# Patient Record
Sex: Female | Born: 1960 | Race: Black or African American | Hispanic: No | Marital: Married | Smoking: Never smoker
Health system: Southern US, Community
[De-identification: ages and names within clinical notes are randomized; demographics above are authoritative.]

## PROBLEM LIST (undated history)

## (undated) DIAGNOSIS — E119 Type 2 diabetes mellitus without complications: Secondary | ICD-10-CM

## (undated) DIAGNOSIS — I10 Essential (primary) hypertension: Secondary | ICD-10-CM

## (undated) HISTORY — PX: BREAST SURGERY: SHX581

## (undated) HISTORY — PX: ABDOMINAL HYSTERECTOMY: SHX81

## (undated) HISTORY — PX: NASAL SINUS SURGERY: SHX719

---

## 2015-02-28 ENCOUNTER — Encounter (HOSPITAL_BASED_OUTPATIENT_CLINIC_OR_DEPARTMENT_OTHER): Payer: Self-pay | Admitting: *Deleted

## 2015-02-28 ENCOUNTER — Emergency Department (HOSPITAL_BASED_OUTPATIENT_CLINIC_OR_DEPARTMENT_OTHER)
Admission: EM | Admit: 2015-02-28 | Discharge: 2015-02-28 | Disposition: A | Discharge status: Home / Self Care | Payer: Medicaid Other | Attending: Emergency Medicine | Admitting: Emergency Medicine

## 2015-02-28 ENCOUNTER — Emergency Department (HOSPITAL_BASED_OUTPATIENT_CLINIC_OR_DEPARTMENT_OTHER): Payer: Medicaid Other

## 2015-02-28 DIAGNOSIS — E119 Type 2 diabetes mellitus without complications: Secondary | ICD-10-CM | POA: Diagnosis not present

## 2015-02-28 DIAGNOSIS — Z7982 Long term (current) use of aspirin: Secondary | ICD-10-CM | POA: Diagnosis not present

## 2015-02-28 DIAGNOSIS — S63610A Unspecified sprain of right index finger, initial encounter: Secondary | ICD-10-CM | POA: Insufficient documentation

## 2015-02-28 DIAGNOSIS — I10 Essential (primary) hypertension: Secondary | ICD-10-CM | POA: Diagnosis not present

## 2015-02-28 DIAGNOSIS — Y9289 Other specified places as the place of occurrence of the external cause: Secondary | ICD-10-CM | POA: Diagnosis not present

## 2015-02-28 DIAGNOSIS — W182XXA Fall in (into) shower or empty bathtub, initial encounter: Secondary | ICD-10-CM | POA: Diagnosis not present

## 2015-02-28 DIAGNOSIS — S6991XA Unspecified injury of right wrist, hand and finger(s), initial encounter: Secondary | ICD-10-CM | POA: Diagnosis present

## 2015-02-28 DIAGNOSIS — Y93E1 Activity, personal bathing and showering: Secondary | ICD-10-CM | POA: Diagnosis not present

## 2015-02-28 DIAGNOSIS — S63612A Unspecified sprain of right middle finger, initial encounter: Secondary | ICD-10-CM | POA: Insufficient documentation

## 2015-02-28 DIAGNOSIS — Y998 Other external cause status: Secondary | ICD-10-CM | POA: Insufficient documentation

## 2015-02-28 DIAGNOSIS — S63619A Unspecified sprain of unspecified finger, initial encounter: Secondary | ICD-10-CM

## 2015-02-28 HISTORY — DX: Type 2 diabetes mellitus without complications: E11.9

## 2015-02-28 HISTORY — DX: Essential (primary) hypertension: I10

## 2015-02-28 MED ORDER — HYDROCODONE-ACETAMINOPHEN 5-325 MG PO TABS: 1.0000 | ORAL_TABLET | Freq: Four times a day (QID) | ORAL | Status: DC | PRN

## 2015-02-28 MED ORDER — HYDROCODONE-ACETAMINOPHEN 5-325 MG PO TABS
1.0000 | ORAL_TABLET | Freq: Once | ORAL | Status: AC
  Administered 2015-02-28: 1 via ORAL
  Filled 2015-02-28: qty 1

## 2015-02-28 MED ORDER — NAPROXEN 500 MG PO TABS: 500.0000 mg | ORAL_TABLET | Freq: Two times a day (BID) | ORAL | Status: DC

## 2015-02-28 NOTE — ED Notes (Signed)
Larey SeatFell getting out of the shower. Right hand injury.

## 2015-02-28 NOTE — ED Provider Notes (Signed)
CSN: [id number]     Arrival date & time 02/28/15  2130 History  This chart was scribed for Gwyneth SproutWhitney Joanthan Hlavacek, MD by Richarda Overlieichard Holland, ED Scribe. This patient was seen in room MH01/MH01 and the patient's care was started 10:28 PM.     Chief Complaint  Patient presents with  . Hand Injury   HPI HPI Comments: Sharon Hamilton is a 54 y.o. female who presents to the Emergency Department complaining of a right hand injury that occurred earlier tonight. Pt states she was getting out of the shower earlier when she slipped and fell. She states that she hit her right hand on the wall when she was falling. Pt complains of constant pain over her right 2nd and 3rd digits at this time. She denies any right wrist pain. Pt reports she is allergic to diovan. Pt reports that certain movements aggravates her pain. She reports no alleviating factors at this time.   Past Medical History  Diagnosis Date  . Diabetes mellitus without complication   . Hypertension    Past Surgical History  Procedure Laterality Date  . Abdominal hysterectomy    . Nasal sinus surgery    . Breast surgery     No family history on file. History  Substance Use Topics  . Smoking status: Never Smoker   . Smokeless tobacco: Not on file  . Alcohol Use: No   OB History    No data available     Review of Systems  Musculoskeletal: Positive for arthralgias.  A complete 10 system review of systems was obtained and all systems are negative except as noted in the HPI and PMH.   Allergies  Review of patient's allergies indicates no known allergies.  Home Medications   Prior to Admission medications   Medication Sig Start Date End Date Taking? Authorizing Provider  aspirin 81 MG tablet Take 81 mg by mouth daily.   Yes Historical Provider, MD  GLIPIZIDE PO Take by mouth.   Yes Historical Provider, MD  HYDROCHLOROTHIAZIDE PO Take by mouth.   Yes Historical Provider, MD  Metoprolol Succinate (TOPROL XL PO) Take by mouth.   Yes Historical  Provider, MD  METOPROLOL TARTRATE PO Take by mouth.   Yes Historical Provider, MD  SitaGLIPtin-MetFORMIN HCl (JANUMET PO) Take by mouth.   Yes Historical Provider, MD   BP 164/100 mmHg  Pulse 62  Temp(Src) 98 F (36.7 C) (Oral)  Resp 20  Ht 5\' 6"  (1.676 m)  Wt 250 lb (113.399 kg)  BMI 40.37 kg/m2  SpO2 96%   Physical Exam  Constitutional: She is oriented to person, place, and time. She appears well-developed and well-nourished.  HENT:  Head: Normocephalic and atraumatic.  Eyes: Right eye exhibits no discharge. Left eye exhibits no discharge.  Neck: Neck supple. No tracheal deviation present.  Cardiovascular: Normal rate.   Pulmonary/Chest: Effort normal. No respiratory distress.  Abdominal: She exhibits no distension.  Musculoskeletal: She exhibits tenderness.  Tenderness over the right 3rd MTP joint. Normal sensation and function of the fingers. No wrist tenderness. 2+ radial pulses.  Neurological: She is alert and oriented to person, place, and time.  Skin: Skin is warm and dry.  Psychiatric: She has a normal mood and affect. Her behavior is normal.  Nursing note and vitals reviewed.   ED Course  Procedures  DIAGNOSTIC STUDIES: Oxygen Saturation is 96% on RA, normal by my interpretation.    COORDINATION OF CARE: 10:30 PM Discussed treatment plan with pt at bedside and pt agreed  to plan.  Labs Review Labs Reviewed - No data to display  Imaging Review Dg Hand Complete Right  02/28/2015   CLINICAL DATA:  Fall getting out of shower tonight. Hit right hand. Swelling along right index and middle fingers.  EXAM: RIGHT HAND - COMPLETE 3+ VIEW  COMPARISON:  06/29/2014  FINDINGS: No acute bony abnormality. Specifically, no fracture, subluxation, or dislocation. Soft tissues are intact.  IMPRESSION: No acute bony abnormality.   Electronically Signed   By: Charlett Nose M.D.   On: 02/28/2015 21:50     EKG Interpretation None      MDM   Final diagnoses:  Finger sprain,  initial encounter   Patient with mechanical fall when she got out of the shower today and hit her hand on the wall. She is complaining of tenderness over the third digit. Imaging is negative. Patient treated for finger sprain.  I personally performed the services described in this documentation, which was scribed in my presence.  The recorded information has been reviewed and considered.      Gwyneth Sprout, MD 02/28/15 2326

## 2015-02-28 NOTE — Discharge Instructions (Signed)
Finger Sprain  A finger sprain is a tear in one of the strong, fibrous tissues that connect the bones (ligaments) in your finger. The severity of the sprain depends on how much of the ligament is torn. The tear can be either partial or complete.  CAUSES   Often, sprains are a result of a fall or accident. If you extend your hands to catch an object or to protect yourself, the force of the impact causes the fibers of your ligament to stretch too much. This excess tension causes the fibers of your ligament to tear.  SYMPTOMS   You may have some loss of motion in your finger. Other symptoms include:   Bruising.   Tenderness.   Swelling.  DIAGNOSIS   In order to diagnose finger sprain, your caregiver will physically examine your finger or thumb to determine how torn the ligament is. Your caregiver may also suggest an X-ray exam of your finger to make sure no bones are broken.  TREATMENT   If your ligament is only partially torn, treatment usually involves keeping the finger in a fixed position (immobilization) for a short period. To do this, your caregiver will apply a bandage, cast, or splint to keep your finger from moving until it heals. For a partially torn ligament, the healing process usually takes 2 to 3 weeks.  If your ligament is completely torn, you may need surgery to reconnect the ligament to the bone. After surgery a cast or splint will be applied and will need to stay on your finger or thumb for 4 to 6 weeks while your ligament heals.  HOME CARE INSTRUCTIONS   Keep your injured finger elevated, when possible, to decrease swelling.   To ease pain and swelling, apply ice to your joint twice a day, for 2 to 3 days:   Put ice in a plastic bag.   Place a towel between your skin and the bag.   Leave the ice on for 15 minutes.   Only take over-the-counter or prescription medicine for pain as directed by your caregiver.   Do not wear rings on your injured finger.   Do not leave your finger unprotected  until pain and stiffness go away (usually 3 to 4 weeks).   Do not allow your cast or splint to get wet. Cover your cast or splint with a plastic bag when you shower or bathe. Do not swim.   Your caregiver may suggest special exercises for you to do during your recovery to prevent or limit permanent stiffness.  SEEK IMMEDIATE MEDICAL CARE IF:   Your cast or splint becomes damaged.   Your pain becomes worse rather than better.  MAKE SURE YOU:   Understand these instructions.   Will watch your condition.   Will get help right away if you are not doing well or get worse.  Document Released: 10/17/2004 Document Revised: 12/02/2011 Document Reviewed: 05/13/2011  [employer] Patient Information 2015 [employer]. This information is not intended to replace advice given to you by your health care provider. Make sure you discuss any questions you have with your health care provider.

## 2016-08-27 ENCOUNTER — Emergency Department (HOSPITAL_BASED_OUTPATIENT_CLINIC_OR_DEPARTMENT_OTHER)
Admission: EM | Admit: 2016-08-27 | Discharge: 2016-08-27 | Disposition: A | Discharge status: Home / Self Care | Payer: Medicaid Other | Attending: Emergency Medicine | Admitting: Emergency Medicine

## 2016-08-27 ENCOUNTER — Encounter (HOSPITAL_BASED_OUTPATIENT_CLINIC_OR_DEPARTMENT_OTHER): Payer: Self-pay | Admitting: *Deleted

## 2016-08-27 DIAGNOSIS — I10 Essential (primary) hypertension: Secondary | ICD-10-CM | POA: Diagnosis not present

## 2016-08-27 DIAGNOSIS — Z7982 Long term (current) use of aspirin: Secondary | ICD-10-CM | POA: Diagnosis not present

## 2016-08-27 DIAGNOSIS — X58XXXA Exposure to other specified factors, initial encounter: Secondary | ICD-10-CM | POA: Diagnosis not present

## 2016-08-27 DIAGNOSIS — Y929 Unspecified place or not applicable: Secondary | ICD-10-CM | POA: Diagnosis not present

## 2016-08-27 DIAGNOSIS — Y999 Unspecified external cause status: Secondary | ICD-10-CM | POA: Diagnosis not present

## 2016-08-27 DIAGNOSIS — Y939 Activity, unspecified: Secondary | ICD-10-CM | POA: Insufficient documentation

## 2016-08-27 DIAGNOSIS — Z794 Long term (current) use of insulin: Secondary | ICD-10-CM | POA: Diagnosis not present

## 2016-08-27 DIAGNOSIS — S39012A Strain of muscle, fascia and tendon of lower back, initial encounter: Secondary | ICD-10-CM

## 2016-08-27 DIAGNOSIS — S3992XA Unspecified injury of lower back, initial encounter: Secondary | ICD-10-CM | POA: Diagnosis present

## 2016-08-27 DIAGNOSIS — E119 Type 2 diabetes mellitus without complications: Secondary | ICD-10-CM | POA: Insufficient documentation

## 2016-08-27 LAB — URINALYSIS, ROUTINE W REFLEX MICROSCOPIC
Bilirubin Urine: NEGATIVE
Glucose, UA: >1000 mg/dL — AB
Hgb urine dipstick: NEGATIVE
Ketones, ur: NEGATIVE mg/dL
LEUKOCYTES UA: NEGATIVE
Nitrite: NEGATIVE
Protein, ur: NEGATIVE mg/dL
SPECIFIC GRAVITY, URINE: 1.042 — AB (ref 1.005–1.030)
pH: 5 (ref 5.0–8.0)

## 2016-08-27 MED ORDER — HYDROCODONE-ACETAMINOPHEN 5-325 MG PO TABS: 1.0000 | ORAL_TABLET | Freq: Four times a day (QID) | ORAL | 0 refills | Status: AC | PRN

## 2016-08-27 MED ORDER — NAPROXEN 500 MG PO TABS: 500.0000 mg | ORAL_TABLET | Freq: Two times a day (BID) | ORAL | 0 refills | Status: AC | PRN

## 2016-08-27 MED ORDER — METHOCARBAMOL 500 MG PO TABS: 500.0000 mg | ORAL_TABLET | Freq: Two times a day (BID) | ORAL | 0 refills | Status: AC | PRN

## 2016-08-27 NOTE — ED Triage Notes (Signed)
Lower back pain x 2 days. Feels like muscle spasms.

## 2016-08-27 NOTE — ED Provider Notes (Signed)
[facility]   CSN: [id number] Arrival date & time: 08/27/16  1724  By signing my [id number] below, I, Soijett Blue, attest that this documentation has been prepared under the direction and in the presence of Elizabeth SauerJaime Ward, PA-C Electronically Signed: Soijett Blue, ED Scribe. 08/27/16. 8:06 PM.  History   Chief Complaint Chief Complaint  Patient presents with  . Back Pain    HPI Sharon Hamilton is a 55 y.o. female with a PMHx of DM, HTN, who presents to the Emergency Department complaining of waxing and waning, shooting, stabbing, right mid-to-lower back pain onset 2 days ago. Patient states she started walking and walked around the mall two times which is a change from baseline activity. She reports that the back pain does radiate to her right side. Pt rates her right mid-to-lower back pain as 4/5 at rest and 10/10 with movement. Pt states that her right mid-to-lower back pain is worsened with position change and denies alleviating factors for her right mid-to-lower back pain. She states that she has tried 3 aleve with mild relief for her symptoms. Pt denies numbness, tingling, saddle paresthesia, bowel/bladder incontinence, fever, chills, hematuria, dysuria, abdominal pain, and any other symptoms. Pt states that her blood sugar ranges from 120-170 typically. Pt notes that her PCP is Dr. Dennis BastYuri Cabeza.     The history is provided by the patient. No language interpreter was used.    Past Medical History:  Diagnosis Date  . Diabetes mellitus without complication (HCC)   . Hypertension     There are no active problems to display for this patient.   Past Surgical History:  Procedure Laterality Date  . ABDOMINAL HYSTERECTOMY    . BREAST SURGERY    . NASAL SINUS SURGERY      OB History    No data available       Home Medications    Prior to Admission medications   Medication Sig Start Date End Date Taking? Authorizing Provider  AMLODIPINE BESYLATE PO Take by  mouth.   Yes Historical Provider, MD  aspirin 81 MG tablet Take 81 mg by mouth daily.   Yes Historical Provider, MD  Canagliflozin (INVOKANA PO) Take by mouth.   Yes Historical Provider, MD  HYDROCHLOROTHIAZIDE PO Take by mouth.   Yes Historical Provider, MD  insulin regular (NOVOLIN R,HUMULIN R) 250 units/2.385mL (100 units/mL) injection Inject into the skin 3 (three) times daily before meals.   Yes Historical Provider, MD  METFORMIN HCL PO Take by mouth.   Yes Historical Provider, MD  METOPROLOL TARTRATE PO Take by mouth.   Yes Historical Provider, MD  GLIPIZIDE PO Take by mouth.    Historical Provider, MD  HYDROcodone-acetaminophen (NORCO/VICODIN) 5-325 MG tablet Take 1 tablet by mouth every 6 (six) hours as needed for severe pain. 08/27/16   Chase PicketJaime Pilcher Ward, PA-C  methocarbamol (ROBAXIN) 500 MG tablet Take 1 tablet (500 mg total) by mouth 2 (two) times daily as needed for muscle spasms. 08/27/16   Chase PicketJaime Pilcher Ward, PA-C  Metoprolol Succinate (TOPROL XL PO) Take by mouth.    Historical Provider, MD  naproxen (NAPROSYN) 500 MG tablet Take 1 tablet (500 mg total) by mouth 2 (two) times daily as needed. 08/27/16   Jaime Pilcher Ward, PA-C  SitaGLIPtin-MetFORMIN HCl (JANUMET PO) Take by mouth.    Historical Provider, MD    Family History No family history on file.  Social History Social History  Substance Use Topics  . Smoking status: Never Smoker  .  Smokeless tobacco: Never Used  . Alcohol use No     Allergies   Patient has no known allergies.   Review of Systems Review of Systems  Constitutional: Negative for chills and fever.  Gastrointestinal: Negative for abdominal pain.       No bowel incontinence.   Genitourinary: Negative for dysuria and hematuria.       No bladder incontinence.   Musculoskeletal: Positive for back pain (right mid to lower).  Neurological: Negative for numbness.       No tingling    Physical Exam Updated Vital Signs BP 128/80 (BP Location: Right  Arm)   Pulse 70   Temp 98.2 F (36.8 C) (Oral)   Resp 16   Ht 5\' 7"  (1.702 m)   Wt 113.4 kg   SpO2 94%   BMI 39.16 kg/m   Physical Exam  Constitutional: She is oriented to person, place, and time. She appears well-developed and well-nourished. No distress.  HENT:  Head: Normocephalic and atraumatic.  Cardiovascular: Normal rate, regular rhythm and normal heart sounds.   No murmur heard. Pulmonary/Chest: Effort normal and breath sounds normal. No respiratory distress.  Abdominal: Soft. She exhibits no distension. There is no tenderness.  Musculoskeletal: She exhibits no edema.       Lumbar back: She exhibits tenderness. She exhibits no bony tenderness.  No lumbar midline spinal tenderness. Pain reproducible with torso rotation. Negative SLR.  Neurological: She is alert and oriented to person, place, and time. No sensory deficit.  BLE NVI.  Skin: Skin is warm and dry.  Nursing note and vitals reviewed.  ED Treatments / Results  DIAGNOSTIC STUDIES: Oxygen Saturation is 100% on RA, nl by my interpretation.    COORDINATION OF CARE: 8:03 PM Discussed treatment plan with pt at bedside which includes UA, norco Rx, robaxin Rx, naprosyn Rx and pt agreed to plan.   Labs (all labs ordered are listed, but only abnormal results are displayed) Labs Reviewed  URINALYSIS, ROUTINE W REFLEX MICROSCOPIC - Abnormal; Notable for the following:       Result Value   Specific Gravity, Urine 1.042 (*)    Glucose, UA >1000 (*)    All other components within normal limits    Procedures Procedures (including critical care time)  Medications Ordered in ED Medications - No data to display   Initial Impression / Assessment and Plan / ED Course  I have reviewed the triage vital signs and the nursing notes.  Pertinent labs that were available during my care of the patient were reviewed by me and considered in my medical decision making (see chart for details).  Clinical Course as of Aug 27 2112  Tue Aug 27, 2016  2107 History of DM- sugars running 170's. No sxs of DKA Glucose: (!) >1000 [JW]    Clinical Course User Index [JW] [facility] CenterJaime Pilcher Ward, PA-C   Sharon Hamilton presents with back pain. Patient demonstrates no lower extremity weakness, saddle anesthesia, bowel or bladder incontinence, or neuro deficits. No concern for cauda equina. No fevers or other infectious symptoms to suggest that the patient's back pain is due to an infection. Lower extremities are neurovascularly intact and patient is ambulating without difficulty. I have reviewed return precautions, including the development of any of these signs or symptoms, and the patient has voiced understanding. I reviewed supportive care instructions, including NSAIDs, early range of motion exercises, and PCP follow-up if symptoms do not improve for referral to physical therapy. RX for naproxen, robaxin. Patient  voiced understanding and agreement with plan.    Final Clinical Impressions(s) / ED Diagnoses   Final diagnoses:  Strain of lumbar region, initial encounter    New Prescriptions Discharge Medication List as of 08/27/2016  8:22 PM    START taking these medications   Details  methocarbamol (ROBAXIN) 500 MG tablet Take 1 tablet (500 mg total) by mouth 2 (two) times daily as needed for muscle spasms., Starting Tue 08/27/2016, Print       I personally performed the services described in this documentation, which was scribed in my presence. The recorded information has been reviewed and is accurate.    [facility] Ward, PA-C 08/27/16 2113    Maia Plan, MD 08/28/16 1018

## 2016-08-27 NOTE — Discharge Instructions (Signed)
Naproxen as needed for pain. Robaxin is your muscle relaxer to take as needed. Pain medication only at night as needed for severe pain.   Back Pain:  Your back pain should be treated with medicines such as ibuprofen or aleve and this back pain should get better over the next 2 weeks.  However if you develop severe or worsening pain, low back pain with fever, numbness, weakness or inability to walk or urinate, you should return to the ER immediately.  Please follow up with your doctor this week for a recheck if still having symptoms.  Low back pain is discomfort in the lower back that may be due to injuries to muscles and ligaments around the spine. Occasionally, it may be caused by a a problem to a part of the spine called a disc. The pain may last several days or a week;  However, most patients get completely well in 4 weeks.  COLD THERAPY DIRECTIONS:  Ice or gel packs can be used to reduce both pain and swelling. Ice is the most helpful within the first 24 to 48 hours after an injury or flareup from overusing a muscle or joint.  Ice is effective, has very few side effects, and is safe for most people to use.   If you expose your skin to cold temperatures for too long or without the proper protection, you can damage your skin or nerves. Watch for signs of skin damage due to cold.   HOME CARE INSTRUCTIONS  Follow these tips to use ice and cold packs safely.  Place a dry or damp towel between the ice and skin. A damp towel will cool the skin more quickly, so you may need to shorten the time that the ice is used.  For a more rapid response, add gentle compression to the ice.  Ice for no more than 10 to 20 minutes at a time. The bonier the area you are icing, the less time it will take to get the benefits of ice.  Check your skin after 5 minutes to make sure there are no signs of a poor response to cold or skin damage.  Rest 20 minutes or more in between uses.  Once your skin is numb, you can end  your treatment. You can test numbness by very lightly touching your skin. The touch should be so light that you do not see the skin dimple from the pressure of your fingertip. When using ice, most people will feel these normal sensations in this order: cold, burning, aching, and numbness.   You will need to follow up with your primary healthcare provider in 1-2 weeks for reassessment.  Be aware that if you develop new symptoms, such as a fever, leg weakness, difficulty with or loss of control of your urine or bowels, abdominal pain, or more severe pain, you will need to seek medical attention and  / or return to the Emergency department.

## 2016-08-28 LAB — URINALYSIS, MICROSCOPIC (REFLEX): RBC / HPF: NONE SEEN RBC/hpf (ref 0–5)

## 2016-10-17 ENCOUNTER — Emergency Department (HOSPITAL_BASED_OUTPATIENT_CLINIC_OR_DEPARTMENT_OTHER)
Admission: EM | Admit: 2016-10-17 | Discharge: 2016-10-17 | Disposition: A | Discharge status: Home / Self Care | Payer: Medicaid Other | Attending: Emergency Medicine | Admitting: Emergency Medicine

## 2016-10-17 ENCOUNTER — Emergency Department (HOSPITAL_BASED_OUTPATIENT_CLINIC_OR_DEPARTMENT_OTHER): Payer: Medicaid Other

## 2016-10-17 ENCOUNTER — Encounter (HOSPITAL_BASED_OUTPATIENT_CLINIC_OR_DEPARTMENT_OTHER): Payer: Self-pay | Admitting: *Deleted

## 2016-10-17 DIAGNOSIS — I10 Essential (primary) hypertension: Secondary | ICD-10-CM | POA: Insufficient documentation

## 2016-10-17 DIAGNOSIS — Z794 Long term (current) use of insulin: Secondary | ICD-10-CM | POA: Diagnosis not present

## 2016-10-17 DIAGNOSIS — E876 Hypokalemia: Secondary | ICD-10-CM | POA: Diagnosis not present

## 2016-10-17 DIAGNOSIS — E119 Type 2 diabetes mellitus without complications: Secondary | ICD-10-CM | POA: Diagnosis not present

## 2016-10-17 DIAGNOSIS — J189 Pneumonia, unspecified organism: Secondary | ICD-10-CM | POA: Diagnosis not present

## 2016-10-17 DIAGNOSIS — Z7982 Long term (current) use of aspirin: Secondary | ICD-10-CM | POA: Diagnosis not present

## 2016-10-17 DIAGNOSIS — Z79899 Other long term (current) drug therapy: Secondary | ICD-10-CM | POA: Diagnosis not present

## 2016-10-17 DIAGNOSIS — J111 Influenza due to unidentified influenza virus with other respiratory manifestations: Secondary | ICD-10-CM

## 2016-10-17 DIAGNOSIS — R05 Cough: Secondary | ICD-10-CM | POA: Diagnosis present

## 2016-10-17 DIAGNOSIS — R69 Illness, unspecified: Secondary | ICD-10-CM

## 2016-10-17 LAB — BASIC METABOLIC PANEL
ANION GAP: 10 (ref 5–15)
BUN: 14 mg/dL (ref 6–20)
CALCIUM: 9.8 mg/dL (ref 8.9–10.3)
CO2: 28 mmol/L (ref 22–32)
CREATININE: 0.83 mg/dL (ref 0.44–1.00)
Chloride: 98 mmol/L — ABNORMAL LOW (ref 101–111)
GFR calc Af Amer: >60 mL/min (ref >60)
GFR calc non Af Amer: >60 mL/min (ref >60)
GLUCOSE: 158 mg/dL — AB (ref 65–99)
Potassium: 2.9 mmol/L — ABNORMAL LOW (ref 3.5–5.1)
Sodium: 136 mmol/L (ref 135–145)

## 2016-10-17 LAB — URINALYSIS, ROUTINE W REFLEX MICROSCOPIC
Bilirubin Urine: NEGATIVE
Glucose, UA: >=500 mg/dL — AB
HGB URINE DIPSTICK: NEGATIVE
KETONES UR: 15 mg/dL — AB
LEUKOCYTES UA: NEGATIVE
Nitrite: NEGATIVE
PH: 6 (ref 5.0–8.0)
Protein, ur: NEGATIVE mg/dL
Specific Gravity, Urine: 1.027 (ref 1.005–1.030)

## 2016-10-17 LAB — CBC WITH DIFFERENTIAL/PLATELET
Basophils Absolute: 0 10*3/uL (ref 0.0–0.1)
Basophils Relative: 0 %
EOS PCT: 2 %
Eosinophils Absolute: 0.1 10*3/uL (ref 0.0–0.7)
HEMATOCRIT: 44 % (ref 36.0–46.0)
Hemoglobin: 14.9 g/dL (ref 12.0–15.0)
LYMPHS ABS: 1.3 10*3/uL (ref 0.7–4.0)
LYMPHS PCT: 21 %
MCH: 29.3 pg (ref 26.0–34.0)
MCHC: 33.9 g/dL (ref 30.0–36.0)
MCV: 86.6 fL (ref 78.0–100.0)
MONO ABS: 0.6 10*3/uL (ref 0.1–1.0)
MONOS PCT: 10 %
Neutro Abs: 4.1 10*3/uL (ref 1.7–7.7)
Neutrophils Relative %: 67 %
Platelets: 245 10*3/uL (ref 150–400)
RBC: 5.08 MIL/uL (ref 3.87–5.11)
RDW: 14.4 % (ref 11.5–15.5)
WBC: 6.2 10*3/uL (ref 4.0–10.5)

## 2016-10-17 LAB — CBG MONITORING, ED: Glucose-Capillary: 159 mg/dL — ABNORMAL HIGH (ref 65–99)

## 2016-10-17 LAB — URINALYSIS, MICROSCOPIC (REFLEX)

## 2016-10-17 LAB — I-STAT CG4 LACTIC ACID, ED: LACTIC ACID, VENOUS: 1.3 mmol/L (ref 0.5–1.9)

## 2016-10-17 MED ORDER — DEXTROSE 5 % IV SOLN
1.0000 g | Freq: Once | INTRAVENOUS | Status: AC
  Administered 2016-10-17: 1 g via INTRAVENOUS
  Filled 2016-10-17: qty 10

## 2016-10-17 MED ORDER — ONDANSETRON HCL 4 MG/2ML IJ SOLN
4.0000 mg | Freq: Once | INTRAMUSCULAR | Status: AC
  Administered 2016-10-17: 4 mg via INTRAVENOUS
  Filled 2016-10-17: qty 2

## 2016-10-17 MED ORDER — OSELTAMIVIR PHOSPHATE 75 MG PO CAPS: 75.0000 mg | ORAL_CAPSULE | Freq: Two times a day (BID) | ORAL | 0 refills | Status: AC

## 2016-10-17 MED ORDER — POTASSIUM CHLORIDE ER 10 MEQ PO TBCR: 10.0000 meq | EXTENDED_RELEASE_TABLET | Freq: Two times a day (BID) | ORAL | 0 refills | Status: AC

## 2016-10-17 MED ORDER — POTASSIUM CHLORIDE 10 MEQ/100ML IV SOLN
10.0000 meq | Freq: Once | INTRAVENOUS | Status: AC
  Administered 2016-10-17: 10 meq via INTRAVENOUS
  Filled 2016-10-17: qty 100

## 2016-10-17 MED ORDER — OSELTAMIVIR PHOSPHATE 75 MG PO CAPS
75.0000 mg | ORAL_CAPSULE | Freq: Once | ORAL | Status: AC
  Administered 2016-10-17: 75 mg via ORAL
  Filled 2016-10-17: qty 1

## 2016-10-17 MED ORDER — SODIUM CHLORIDE 0.9 % IV BOLUS (SEPSIS)
1000.0000 mL | Freq: Once | INTRAVENOUS | Status: AC
  Administered 2016-10-17: 1000 mL via INTRAVENOUS

## 2016-10-17 MED ORDER — ACETAMINOPHEN 500 MG PO TABS
1000.0000 mg | ORAL_TABLET | Freq: Once | ORAL | Status: AC
  Administered 2016-10-17: 1000 mg via ORAL
  Filled 2016-10-17: qty 2

## 2016-10-17 MED ORDER — DM-GUAIFENESIN ER 30-600 MG PO TB12: 1.0000 | ORAL_TABLET | Freq: Two times a day (BID) | ORAL | 0 refills | Status: AC

## 2016-10-17 MED ORDER — LEVOFLOXACIN 750 MG PO TABS: 750.0000 mg | ORAL_TABLET | Freq: Every day | ORAL | 0 refills | Status: AC

## 2016-10-17 MED ORDER — SODIUM CHLORIDE 0.9 % IV SOLN: INTRAVENOUS | Status: DC

## 2016-10-17 MED ORDER — HYDROMORPHONE HCL 1 MG/ML IJ SOLN
1.0000 mg | Freq: Once | INTRAMUSCULAR | Status: AC
  Administered 2016-10-17: 1 mg via INTRAVENOUS
  Filled 2016-10-17: qty 1

## 2016-10-17 NOTE — ED Notes (Signed)
ED Provider at bedside. 

## 2016-10-17 NOTE — ED Triage Notes (Signed)
Generalized body aches, chills, fever, since last night.

## 2016-10-17 NOTE — Discharge Instructions (Signed)
A symptoms consistent with influenza. Take Tamiflu as directed. Chest x-ray raises concerns for pneumonia take the antibiotic Levaquin as directed. Potassium was low here tonight as you know. Take the oral potassium supplements for the next 2 days as the directed. Make an appointment to follow-up with your regular doctor. Return for any new or worse symptoms.

## 2016-10-17 NOTE — ED Notes (Signed)
Patient is resting comfortably. 

## 2016-10-17 NOTE — ED Provider Notes (Addendum)
[facility] Provider Note   CSN: [id number] Arrival date & time: 10/17/16  1659 By signing my name below, I, Levon Hedger, attest that this documentation has been prepared under the direction and in the presence of Vanetta Mulders, MD . Electronically Signed: Levon Hedger, Scribe. 10/17/2016. 7:53 PM.   History   Chief Complaint Chief Complaint  Patient presents with  . Generalized Body Aches   HPI Sharon Hamilton is a 56 y.o. female with a history of DM who presents to the Emergency Department complaining of constant, gradually worsening generalized body aches onset yesterday. She notes associated congestion, cough, fever (tmax 101.9), SOB, headache, nausea, and loose bowel movements. No alleviating or modifying factors noted.  No prior treatments indicated. She states her husband has similar symptoms and has been sick for one week. Pt denies any visual disturbance, chest pain, abdominal pain, vomiting, diarrhea, dysuria, hematuria, joint swelling, or rash. She denies the use of blood thinners.   The history is provided by the patient. No language interpreter was used.   Past Medical History:  Diagnosis Date  . Diabetes mellitus without complication (HCC)   . Hypertension    There are no active problems to display for this patient.  Past Surgical History:  Procedure Laterality Date  . ABDOMINAL HYSTERECTOMY    . BREAST SURGERY    . NASAL SINUS SURGERY      OB History    No data available     Home Medications    Prior to Admission medications   Medication Sig Start Date End Date Taking? Authorizing Provider  AMLODIPINE BESYLATE PO Take by mouth.   Yes Historical Provider, MD  aspirin 81 MG tablet Take 81 mg by mouth daily.   Yes Historical Provider, MD  Canagliflozin (INVOKANA PO) Take by mouth.   Yes Historical Provider, MD  HYDROCHLOROTHIAZIDE PO Take by mouth.   Yes Historical Provider, MD  insulin regular (NOVOLIN R,HUMULIN R) 250 units/2.101mL (100  units/mL) injection Inject into the skin 3 (three) times daily before meals.   Yes Historical Provider, MD  METFORMIN HCL PO Take by mouth.   Yes Historical Provider, MD  Metoprolol Succinate (TOPROL XL PO) Take by mouth.   Yes Historical Provider, MD  GLIPIZIDE PO Take by mouth.    Historical Provider, MD  HYDROcodone-acetaminophen (NORCO/VICODIN) 5-325 MG tablet Take 1 tablet by mouth every 6 (six) hours as needed for severe pain. 08/27/16   Chase Picket Ward, PA-C  methocarbamol (ROBAXIN) 500 MG tablet Take 1 tablet (500 mg total) by mouth 2 (two) times daily as needed for muscle spasms. 08/27/16   Chase Picket Ward, PA-C  METOPROLOL TARTRATE PO Take by mouth.    Historical Provider, MD  naproxen (NAPROSYN) 500 MG tablet Take 1 tablet (500 mg total) by mouth 2 (two) times daily as needed. 08/27/16   Jaime Pilcher Ward, PA-C  SitaGLIPtin-MetFORMIN HCl (JANUMET PO) Take by mouth.    Historical Provider, MD    Family History No family history on file.  Social History Social History  Substance Use Topics  . Smoking status: Never Smoker  . Smokeless tobacco: Never Used  . Alcohol use No    Allergies   Diovan [valsartan]  Review of Systems Review of Systems  Constitutional: Positive for fever. Negative for chills.  HENT: Positive for congestion. Negative for rhinorrhea, sneezing and sore throat.   Eyes: Negative for visual disturbance.  Respiratory: Positive for shortness of breath. Negative for cough.   Cardiovascular: Negative for chest  pain.  Gastrointestinal: Positive for nausea. Negative for abdominal pain, diarrhea and vomiting.  Genitourinary: Negative for dysuria and hematuria.  Musculoskeletal: Positive for myalgias. Negative for back pain and joint swelling.  Skin: Negative for rash.  Neurological: Positive for headaches.  Hematological: Does not bruise/bleed easily.  Psychiatric/Behavioral: Negative for confusion.    Physical Exam Updated Vital Signs BP 134/84    Pulse 72   Temp 99.3 F (37.4 C) (Oral)   Resp 14   Ht 5\' 6"  (1.676 m)   Wt 113.4 kg   SpO2 98%   BMI 40.35 kg/m   Physical Exam  Constitutional: She is oriented to person, place, and time. She appears well-developed and well-nourished. No distress.  HENT:  Head: Normocephalic and atraumatic.  Mouth/Throat: No oropharyngeal exudate, posterior oropharyngeal edema or posterior oropharyngeal erythema.  Moist mucus membranes  Eyes: Conjunctivae and EOM are normal. Pupils are equal, round, and reactive to light. No scleral icterus.  Cardiovascular: Normal rate.  Exam reveals no gallop and no friction rub.   No murmur heard. Pulmonary/Chest: Effort normal.  Abdominal: Bowel sounds are normal. She exhibits no distension. There is no tenderness.  Neurological: She is alert and oriented to person, place, and time.  Skin: Skin is warm and dry.  Psychiatric: She has a normal mood and affect.  Nursing note and vitals reviewed.  ED Treatments / Results  DIAGNOSTIC STUDIES:  Oxygen Saturation is 97% on RA, normal by my interpretation.    COORDINATION OF CARE:  7:51 PM Discussed treatment plan with pt at bedside and pt agreed to plan.   Labs (all labs ordered are listed, but only abnormal results are displayed) Labs Reviewed  BASIC METABOLIC PANEL - Abnormal; Notable for the following:       Result Value   Potassium 2.9 (*)    Chloride 98 (*)    Glucose, Bld 158 (*)    All other components within normal limits  URINALYSIS, ROUTINE W REFLEX MICROSCOPIC - Abnormal; Notable for the following:    Glucose, UA >=500 (*)    Ketones, ur 15 (*)    All other components within normal limits  URINALYSIS, MICROSCOPIC (REFLEX) - Abnormal; Notable for the following:    Bacteria, UA RARE (*)    Squamous Epithelial / LPF 0-5 (*)    All other components within normal limits  CBG MONITORING, ED - Abnormal; Notable for the following:    Glucose-Capillary 159 (*)    All other components within  normal limits  CBC WITH DIFFERENTIAL/PLATELET  I-STAT CG4 LACTIC ACID, ED    EKG  EKG Interpretation None       Radiology Dg Chest 2 View  Result Date: 10/17/2016 CLINICAL DATA:  Acute onset of generalized body aches, congestion, cough and fever. Shortness of breath, headache and nausea. Initial encounter. EXAM: CHEST  2 VIEW COMPARISON:  None. FINDINGS: The lungs are well-aerated. Mild right basilar airspace opacity raises concern for pneumonia. There is no evidence of pleural effusion or pneumothorax. The heart is borderline normal in size. No acute osseous abnormalities are seen. IMPRESSION: Mild right basilar airspace opacity raises concern for pneumonia. Electronically Signed   By: Roanna Raider M.D.   On: 10/17/2016 20:48    Procedures Procedures (including critical care time)  CRITICAL CARE Performed by: Vanetta Mulders Total critical care time: 30 minutes Critical care time was exclusive of separately billable procedures and treating other patients. Critical care was necessary to treat or prevent imminent or life-threatening deterioration. Critical care was  time spent personally by me on the following activities: development of treatment plan with patient and/or surrogate as well as nursing, discussions with consultants, evaluation of patient's response to treatment, examination of patient, obtaining history from patient or surrogate, ordering and performing treatments and interventions, ordering and review of laboratory studies, ordering and review of radiographic studies, pulse oximetry and re-evaluation of patient's condition.   Medications Ordered in ED Medications  0.9 %  sodium chloride infusion (not administered)  cefTRIAXone (ROCEPHIN) 1 g in dextrose 5 % 50 mL IVPB (not administered)  acetaminophen (TYLENOL) tablet 1,000 mg (1,000 mg Oral Given 10/17/16 1728)  sodium chloride 0.9 % bolus 1,000 mL (0 mLs Intravenous Stopped 10/17/16 2148)  ondansetron (ZOFRAN)  injection 4 mg (4 mg Intravenous Given 10/17/16 2020)  HYDROmorphone (DILAUDID) injection 1 mg (1 mg Intravenous Given 10/17/16 2020)  potassium chloride 10 mEq in 100 mL IVPB (0 mEq Intravenous Stopped 10/17/16 2146)  potassium chloride 10 mEq in 100 mL IVPB (10 mEq Intravenous New Bag/Given 10/17/16 2146)  oseltamivir (TAMIFLU) capsule 75 mg (75 mg Oral Given 10/17/16 2128)     Initial Impression / Assessment and Plan / ED Course  I have reviewed the triage vital signs and the nursing notes.  Pertinent labs & imaging results that were available during my care of the patient were reviewed by me and considered in my medical decision making (see chart for details).    A symptoms consistent with flulike illness. Chest x-ray shows evidence of pneumonia. Patient has diabetes blood sugars reasonable. However patient's potassium was low as below 3. Patient received 10 mEq of potassium IV 2 here. Patient received IV fluids. Patient received Rocephin 1 g IV piggyback for the pneumonia. Patient overall showing improvement. Oxygen saturation 7 fine. Following completion of the potassium will recheck potassium level. If showing improvement above 3 patient can be discharged home and oral potassium. Will be continued on Levaquin since she is diabetic for the pneumonia. And she'll be continued on Tamiflu. She got her first dose Tamiflu here.  Oxygen saturations have been normal.     Final Clinical Impressions(s) / ED Diagnoses   Final diagnoses:  Influenza-like illness  Community acquired pneumonia, unspecified laterality  Hypokalemia    New Prescriptions New Prescriptions   No medications on file  I personally performed the services described in this documentation, which was scribed in my presence. The recorded information has been reviewed and is accurate.      Vanetta MuldersScott Susana Duell, MD 10/17/16 [id number]    Vanetta MuldersScott Dailey Alberson, MD 10/17/16 [phone]

## 2019-05-08 ENCOUNTER — Emergency Department (HOSPITAL_BASED_OUTPATIENT_CLINIC_OR_DEPARTMENT_OTHER)
Admission: EM | Admit: 2019-05-08 | Discharge: 2019-05-08 | Disposition: A | Discharge status: Home / Self Care | Payer: Medicaid Other | Attending: Emergency Medicine | Admitting: Emergency Medicine

## 2019-05-08 ENCOUNTER — Emergency Department (HOSPITAL_BASED_OUTPATIENT_CLINIC_OR_DEPARTMENT_OTHER): Payer: Medicaid Other

## 2019-05-08 ENCOUNTER — Other Ambulatory Visit: Payer: Self-pay

## 2019-05-08 DIAGNOSIS — E119 Type 2 diabetes mellitus without complications: Secondary | ICD-10-CM | POA: Insufficient documentation

## 2019-05-08 DIAGNOSIS — Z794 Long term (current) use of insulin: Secondary | ICD-10-CM | POA: Insufficient documentation

## 2019-05-08 DIAGNOSIS — M4802 Spinal stenosis, cervical region: Secondary | ICD-10-CM | POA: Diagnosis not present

## 2019-05-08 DIAGNOSIS — Z79899 Other long term (current) drug therapy: Secondary | ICD-10-CM | POA: Insufficient documentation

## 2019-05-08 DIAGNOSIS — I10 Essential (primary) hypertension: Secondary | ICD-10-CM | POA: Insufficient documentation

## 2019-05-08 DIAGNOSIS — M79602 Pain in left arm: Secondary | ICD-10-CM

## 2019-05-08 DIAGNOSIS — M5412 Radiculopathy, cervical region: Secondary | ICD-10-CM | POA: Insufficient documentation

## 2019-05-08 DIAGNOSIS — Z888 Allergy status to other drugs, medicaments and biological substances status: Secondary | ICD-10-CM | POA: Insufficient documentation

## 2019-05-08 DIAGNOSIS — M542 Cervicalgia: Secondary | ICD-10-CM

## 2019-05-08 DIAGNOSIS — M25512 Pain in left shoulder: Secondary | ICD-10-CM | POA: Diagnosis present

## 2019-05-08 MED ORDER — LIDOCAINE 5 % EX PTCH: 1.0000 | MEDICATED_PATCH | CUTANEOUS | 0 refills | Status: AC

## 2019-05-08 MED ORDER — OXYCODONE-ACETAMINOPHEN 5-325 MG PO TABS
1.0000 | ORAL_TABLET | ORAL | 0 refills | Status: AC | PRN
Start: 2019-05-08 — End: ?

## 2019-05-08 MED ORDER — LORAZEPAM 2 MG/ML IJ SOLN
1.0000 mg | Freq: Once | INTRAMUSCULAR | Status: AC
  Administered 2019-05-08: 1 mg via INTRAVENOUS
  Filled 2019-05-08: qty 1

## 2019-05-08 MED ORDER — PREDNISONE 10 MG PO TABS: ORAL_TABLET | ORAL | 0 refills | Status: AC

## 2019-05-08 MED ORDER — MORPHINE SULFATE (PF) 4 MG/ML IV SOLN
4.0000 mg | Freq: Once | INTRAVENOUS | Status: AC
  Administered 2019-05-08: 4 mg via INTRAVENOUS
  Filled 2019-05-08: qty 1

## 2019-05-08 NOTE — Discharge Instructions (Signed)
Your history, exam, and imaging today are consistent with a nerve pain from your neck causing your symptoms.  Your MRI did not show evidence of cord compromise or significant injury however there was advanced degenerative disease likely causing your symptoms.  After our shared decision-making conversation, we agreed on treatment with steroids, Lidoderm patch, and pain medication.  Please keep an eye on your glucose given your diabetes with the steroids.  Please follow-up with your pain team and your spine team.  If you are unable to see your spine team, please try the neurosurgeon listed in this paperwork for evaluation.  If any symptoms change or worsen, please return to the nearest emergency department.

## 2019-05-08 NOTE — ED Notes (Signed)
Taken to MRI at this time.  Premedicated with ativan per order.

## 2019-05-08 NOTE — ED Notes (Signed)
Provider at the bedside.  

## 2019-05-08 NOTE — ED Triage Notes (Signed)
Patient states that she was weeding in her yard three days ago and hurt her left shoulder. She reports that she has increased pain with movement to her left shoulder and up into her neck  - denies any numbness or tingling

## 2019-05-08 NOTE — ED Provider Notes (Signed)
[facility] EMERGENCY DEPARTMENT Provider Note   CSN: [id number] Arrival date & time: 05/08/19  1456     History   Chief Complaint Chief Complaint  Patient presents with   Shoulder Pain    HPI Sharon Hamilton is a 58 y.o. female.      Shoulder Pain Location:  Arm Injury: yes   Time since incident:  3 days Mechanism of injury comment:  Jerking injury Pain details:    Quality:  Shooting, tingling and sharp   Radiates to: neck.   Severity:  Severe   Onset quality:  Sudden   Timing:  Constant   Progression:  Unchanged Handedness:  Right-handed Dislocation: no   Foreign body present:  Unable to specify Prior injury to area:  Yes Relieved by:  Nothing Worsened by:  Movement Associated symptoms: neck pain   Associated symptoms: no back pain, no fatigue and no fever     Past Medical History:  Diagnosis Date   Diabetes mellitus without complication (HCC)    Hypertension     There are no active problems to display for this patient.   Past Surgical History:  Procedure Laterality Date   ABDOMINAL HYSTERECTOMY     BREAST SURGERY     NASAL SINUS SURGERY       OB History   No obstetric history on file.      Home Medications    Prior to Admission medications   Medication Sig Start Date End Date Taking? Authorizing Provider  AMLODIPINE BESYLATE PO Take by mouth.    [provider]  aspirin 81 MG tablet Take 81 mg by mouth daily.    [provider]  Canagliflozin (INVOKANA PO) Take by mouth.    [provider]  dextromethorphan-guaiFENesin (MUCINEX DM) 30-600 MG 12hr tablet Take 1 tablet by mouth 2 (two) times daily. 10/17/16   Vanetta MuldersZackowski, Scott, MD  GLIPIZIDE PO Take by mouth.    [provider]  HYDROCHLOROTHIAZIDE PO Take by mouth.    [provider]  HYDROcodone-acetaminophen (NORCO/VICODIN) 5-325 MG tablet Take 1 tablet by mouth every 6 (six) hours as needed for severe pain. 08/27/16   Ward, Chase PicketJaime  Pilcher, PA-C  insulin regular (NOVOLIN R,HUMULIN R) 250 units/2.395mL (100 units/mL) injection Inject into the skin 3 (three) times daily before meals.    [provider]  levofloxacin (LEVAQUIN) 750 MG tablet Take 1 tablet (750 mg total) by mouth daily. 10/17/16   Vanetta MuldersZackowski, Scott, MD  METFORMIN HCL PO Take by mouth.    [provider]  methocarbamol (ROBAXIN) 500 MG tablet Take 1 tablet (500 mg total) by mouth 2 (two) times daily as needed for muscle spasms. 08/27/16   Ward, Chase PicketJaime Pilcher, PA-C  Metoprolol Succinate (TOPROL XL PO) Take by mouth.    [provider]  METOPROLOL TARTRATE PO Take by mouth.    [provider]  naproxen (NAPROSYN) 500 MG tablet Take 1 tablet (500 mg total) by mouth 2 (two) times daily as needed. 08/27/16   Ward, Chase PicketJaime Pilcher, PA-C  oseltamivir (TAMIFLU) 75 MG capsule Take 1 capsule (75 mg total) by mouth every 12 (twelve) hours. 10/17/16   Vanetta MuldersZackowski, Scott, MD  potassium chloride (K-DUR) 10 MEQ tablet Take 1 tablet (10 mEq total) by mouth 2 (two) times daily. 10/17/16   Vanetta MuldersZackowski, Scott, MD  SitaGLIPtin-MetFORMIN HCl (JANUMET PO) Take by mouth.    [provider]    Family History No family history on file.  Social History Social History  Tobacco Use   Smoking status: Never Smoker   Smokeless tobacco: Never Used  Substance Use Topics   Alcohol use: No   Drug use: No     Allergies   Diovan [valsartan]   Review of Systems Review of Systems  Constitutional: Negative for chills, diaphoresis, fatigue and fever.  HENT: Negative for congestion and rhinorrhea.   Eyes: Negative for visual disturbance.  Respiratory: Negative for cough, chest tightness, shortness of breath and wheezing.   Cardiovascular: Negative for palpitations and leg swelling.  Gastrointestinal: Negative for abdominal pain, constipation, diarrhea, nausea and vomiting.  Genitourinary: Negative for dysuria and flank pain.  Musculoskeletal:  Positive for neck pain. Negative for back pain and neck stiffness.  Skin: Negative for rash and wound.  Neurological: Negative for light-headedness, numbness and headaches.  Psychiatric/Behavioral: Negative for agitation.  All other systems reviewed and are negative.    Physical Exam Updated Vital Signs BP (!) 148/94 (BP Location: Left Arm)    Pulse 60    Temp 98.8 F (37.1 C) (Oral)    Resp 18    Ht 5\' 6"  (1.676 m)    Wt 113.4 kg    SpO2 100%    BMI 40.35 kg/m   Physical Exam Vitals signs and nursing note reviewed.  Constitutional:      General: She is not in acute distress.    Appearance: She is well-developed. She is not ill-appearing, toxic-appearing or diaphoretic.  HENT:     Head: Normocephalic and atraumatic.     Right Ear: External ear normal.     Left Ear: External ear normal.     Nose: Nose normal. No congestion or rhinorrhea.     Mouth/Throat:     Mouth: Mucous membranes are moist.     Pharynx: No oropharyngeal exudate.  Eyes:     Conjunctiva/sclera: Conjunctivae normal.     Pupils: Pupils are equal, round, and reactive to light.  Neck:     Musculoskeletal: Normal range of motion and neck supple. Muscular tenderness present.  Cardiovascular:     Rate and Rhythm: Normal rate.     Pulses: Normal pulses.     Heart sounds: No murmur.  Pulmonary:     Effort: No respiratory distress.     Breath sounds: No stridor. No wheezing, rhonchi or rales.  Chest:     Chest wall: No tenderness.  Abdominal:     General: There is no distension.     Tenderness: There is no abdominal tenderness. There is no right CVA tenderness, left CVA tenderness or rebound.  Musculoskeletal:        General: Tenderness present. No deformity.     Right lower leg: No edema.     Left lower leg: No edema.  Skin:    General: Skin is warm.     Findings: No erythema or rash.  Neurological:     General: No focal deficit present.     Mental Status: She is alert and oriented to person, place, and  time.     Sensory: No sensory deficit.     Motor: No weakness or abnormal muscle tone.     Coordination: Coordination normal.     Deep Tendon Reflexes: Reflexes are normal and symmetric.  Psychiatric:        Mood and Affect: Mood normal.      ED Treatments / Results  Labs (all labs ordered are listed, but only abnormal results are displayed) Labs Reviewed - No data to display  EKG None  Radiology Mr Cervical Spine Wo Contrast  Result Date: 05/08/2019 CLINICAL DATA:  Neck and left shoulder pain after pulling weeds 3 days ago. EXAM: MRI CERVICAL SPINE WITHOUT CONTRAST TECHNIQUE: Multiplanar, multisequence MR imaging of the cervical spine was performed. No intravenous contrast was administered. COMPARISON:  None. FINDINGS: Alignment: Cervical spine straightening.  No listhesis. Vertebrae: No fracture or suspicious marrow lesion. Degenerative endplate changes from K4-4 to C7-T1 including mild multilevel edema. Associated disc space narrowing at these levels, severe at C4-5 and moderate at C5-6 and C6-7. Cord: Normal signal. Posterior Fossa, vertebral arteries, paraspinal tissues: Expanded partially empty sella. Preserved vertebral artery flow voids. No evidence of significant paraspinal soft tissue edema. Disc levels: C2-3: Shallow central disc protrusion without stenosis. C3-4: Mild disc bulging and uncovertebral spurring without stenosis. C4-5: Broad-based posterior disc osteophyte complex result in mild spinal stenosis and mild-to-moderate left neural foraminal stenosis. C5-6: Disc bulging/broad posterior disc protrusion slightly eccentric to the left and uncovertebral spurring result in moderate spinal stenosis with mild ventral cord flattening and borderline bilateral neural foraminal stenosis. C6-7: Broad-based posterior disc osteophyte complex results in borderline to mild spinal stenosis and borderline to mild bilateral neural foraminal stenosis. C7-T1: Mild left uncovertebral spurring and  mild left facet arthrosis without significant stenosis. IMPRESSION: 1. Advanced cervical disc degeneration most notable at C5-6 where there is moderate spinal stenosis. 2. Mild spinal stenosis and mild-to-moderate left neural foraminal stenosis at C4-5. Electronically Signed   By: Logan Bores M.D.   On: 05/08/2019 17:31    Procedures Procedures (including critical care time)  Medications Ordered in ED Medications  LORazepam (ATIVAN) injection 1 mg (1 mg Intravenous Given 05/08/19 1629)  morphine 4 MG/ML injection 4 mg (4 mg Intravenous Given 05/08/19 1619)     Initial Impression / Assessment and Plan / ED Course  I have reviewed the triage vital signs and the nursing notes.  Pertinent labs & imaging results that were available during my care of the patient were reviewed by me and considered in my medical decision making (see chart for details).        Luretha Eberly is a 58 y.o. female with a past medical history significant for hypertension, diabetes, and prior cervical nerve injury who presents with sudden onset left neck pain radiating to her left arm.  Patient was a 3 days ago she was pulling weeds in her yard when she suddenly jerked back and felt a sudden pain in her left neck.  She reports the pain radiates all the way down her left arm.  It is sharp and electric with throbbing.  She reports it is 10 out of 10 in severity.  She is concerned that feels similar to when she has injured nerve in the past.  She reports it is worsened when she moves her neck side to side or palpates on her neck.  She has taken muscle relaxant which she is used for sciatic pain and it has not helped.  She is right-handed.  She reports some tingling in the left arm.  She denies any other symptoms including no fevers, chills, chest pain, shortness of breath, nausea, vomiting, urinary or GI symptoms.  Denies other injury.  Pain does not radiate into her head.  On exam, patient has no significant numbness or grip  strength weakness on my exam.  Patient had severe pain with palpation in her midline in the left neck.  Pain with any neck movement side to side.  Lungs clear and chest was nontender.  Shoulder was tender on the muscles, no bony tenderness.  Normal grip strength bilaterally.  Normal pulses in extremities.  Exam otherwise unremarkable.  Hoffmann test negative for acute myelopathy.  Given the patient's known cervical problems in the past as well as the radicular pain that began suddenly with midline neck tenderness, we had a shared decision-making conversation to discuss imaging.  We agreed to get an MRI to look for a nerve injury causing her symptoms.  Given her description, lack of headache, and the radicular type symptoms, lower suspicion for a dissection at this time.  With lack of impact trauma, low suspicion for bony injury in the shoulder or wrist of the arm.  We agreed to hold on x-rays of the shoulder or arm.  Patient was given pain medicine.  She requested anxiety medication for the MRI.  This was ordered.  Anticipate reassessment after work-up.     6:29 PM Patient felt somewhat better after narcotic pain medication.  Patient's MRI showed advanced degenerative disease and foraminal stenosis but no evidence of nerve impingement or cord compromise.  Had a long discussion with patient as far as management for her radicular pain.  As patient is a diabetic, we discussed the risks of a steroid taper however patient assures me that she manages her diabetes very well and will be continued to check her sugars and will return if her glucose is uncontrolled.  She would like to attempt a steroid taper which will be ordered.  We also discussed pain medication and a Lidoderm patch and patient also wanted to try these medications.  Patient will call her spine and pain management team for further management of her pain.  Patient understood return precautions and had no other questions or concerns.  Patient  discharged in good condition with improved symptoms.   Final Clinical Impressions(s) / ED Diagnoses   Final diagnoses:  Cervical radiculopathy  Neck pain  Left arm pain    ED Discharge Orders         Ordered    predniSONE (DELTASONE) 10 MG tablet     05/08/19 1833    oxyCODONE-acetaminophen (PERCOCET/ROXICET) 5-325 MG tablet  Every 4 hours PRN     05/08/19 1833    lidocaine (LIDODERM) 5 %  Every 24 hours     05/08/19 1833           Clinical Impression: 1. Cervical radiculopathy   2. Neck pain   3. Left arm pain     Disposition: Discharge  Condition: Good  I have discussed the results, Dx and Tx plan with the pt(& family if present). He/she/they expressed understanding and agree(s) with the plan. Discharge instructions discussed at great length. Strict return precautions discussed and pt &/or family have verbalized understanding of the instructions. No further questions at time of discharge.    New Prescriptions   LIDOCAINE (LIDODERM) 5 %    Place 1 patch onto the skin daily. Remove & Discard patch within 12 hours or as directed by MD   OXYCODONE-ACETAMINOPHEN (PERCOCET/ROXICET) 5-325 MG TABLET    Take 1 tablet by mouth every 4 (four) hours as needed for severe pain.   PREDNISONE (DELTASONE) 10 MG TABLET    Take 6 tablets (60 mg total) by mouth daily for 5 days, THEN 4 tablets (40 mg total) daily for 5 days, THEN 2 tablets (20 mg total) daily for 5 days.    Follow Up: [place], [facility] [address] [place] [address] [phone]     Andreas Blower., MD [address] [place] [address] [phone]     [facility] [address] [id number] EP [address] [address] [phone]       Aastha Dayley, Canary Brim, MD 05/08/19 [phone]

## 2019-09-30 IMAGING — MR MRI CERVICAL SPINE WITHOUT CONTRAST
4 of 6 series · 29 of 48 positions shown · non-contrast
Comparison: None.

CLINICAL DATA: Neck and left shoulder pain after pulling weeds 3
days ago.

EXAM:
MRI CERVICAL SPINE WITHOUT CONTRAST
TECHNIQUE: Multiplanar, multisequence MR imaging of the cervical spine was
performed. No intravenous contrast was administered.

[Series 2: (id) tse sag · sagittal · 3.0mm · 0.41mm/px · 5 of 13 slices shown]
[im 1/13]
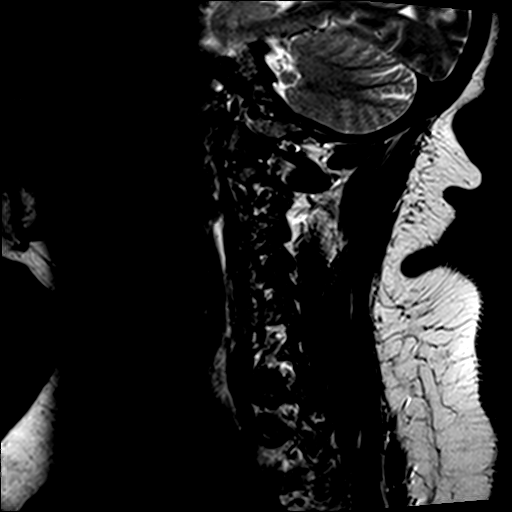
[im 4/13]
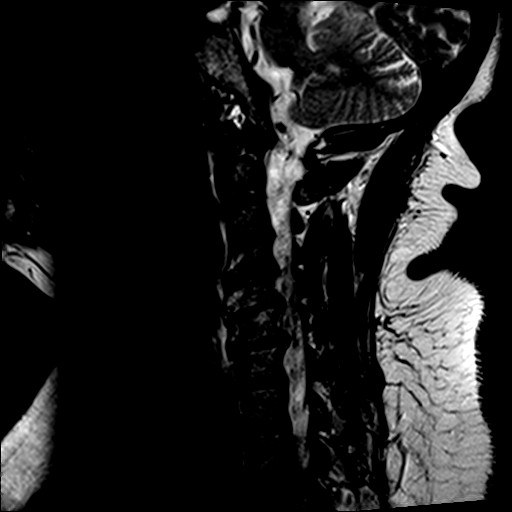
[im 7/13]
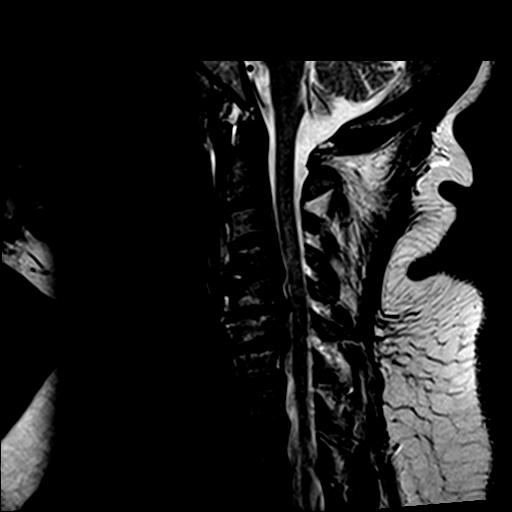
[im 10/13]
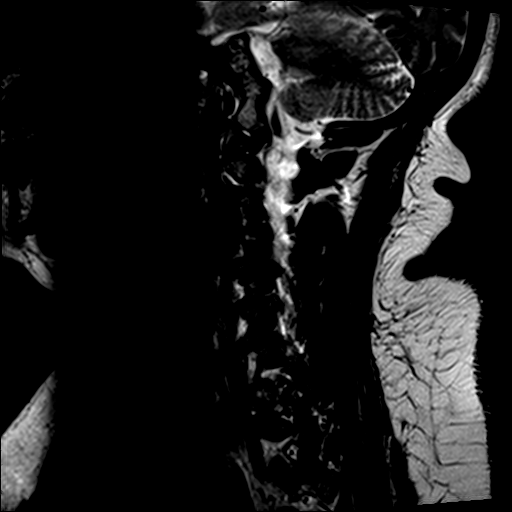
[im 13/13]
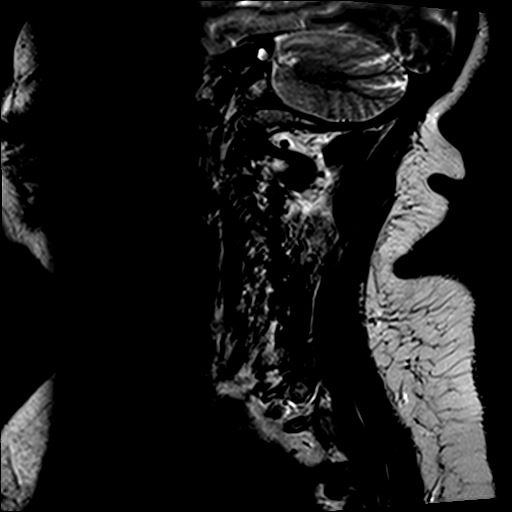

[Series 4: STIR · sagittal · 3.0mm · 0.82mm/px · 6 of 13 slices shown]
[im 1/13]
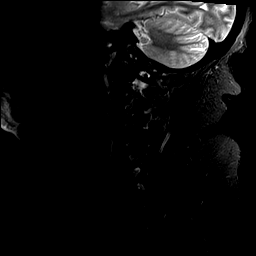
[im 3/13]
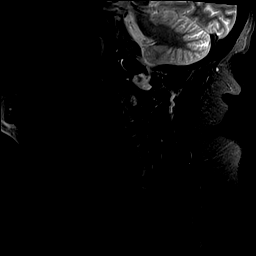
[im 5/13]
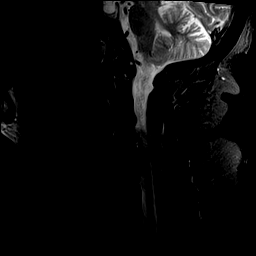
[im 8/13]
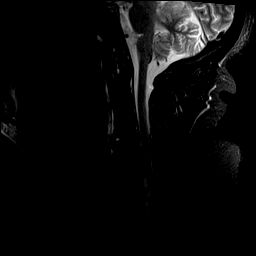
[im 10/13]
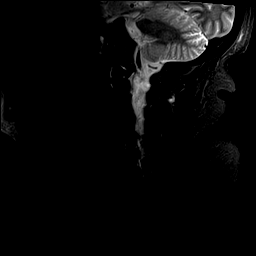
[im 13/13]
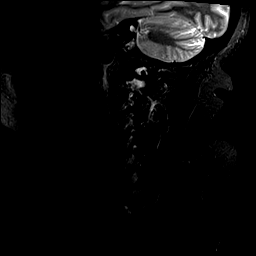

[Series 5: T2 · axial · 3.0mm · 0.39mm/px · z∈[-81,+21]mm · 9 of 30 slices shown (1 of 2)]
[im 1/30]
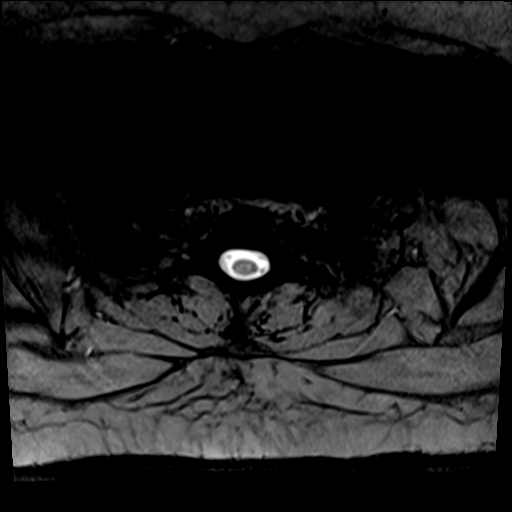
[im 5/30]
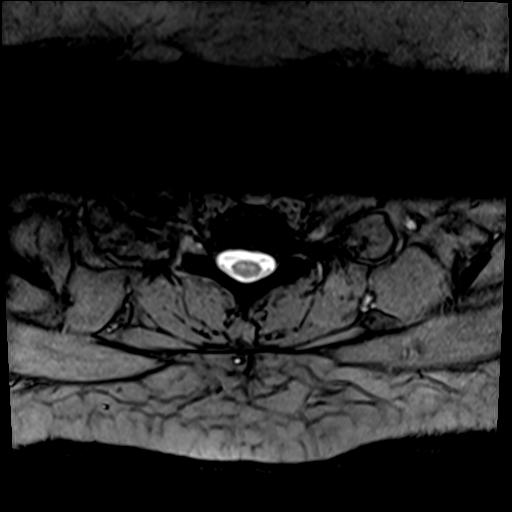
[im 10/30]
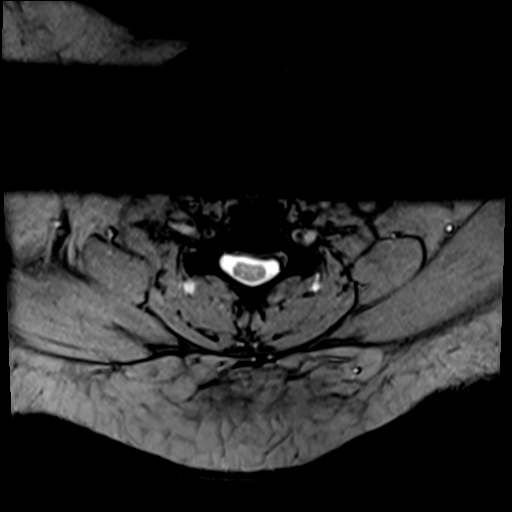
[im 13/30]
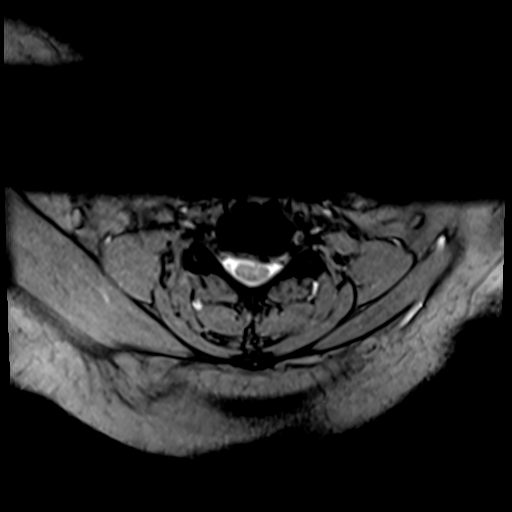
[im 15/30]
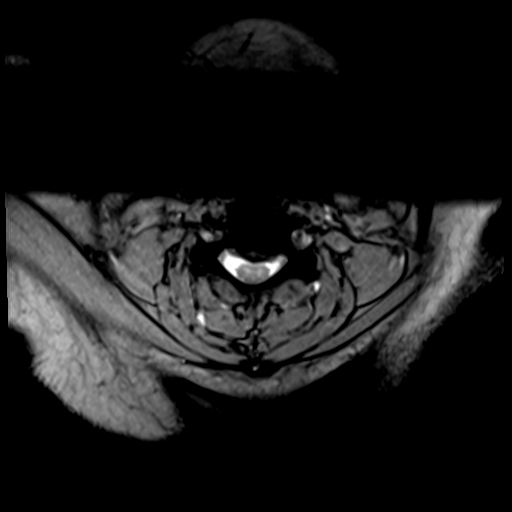
[im 17/30]
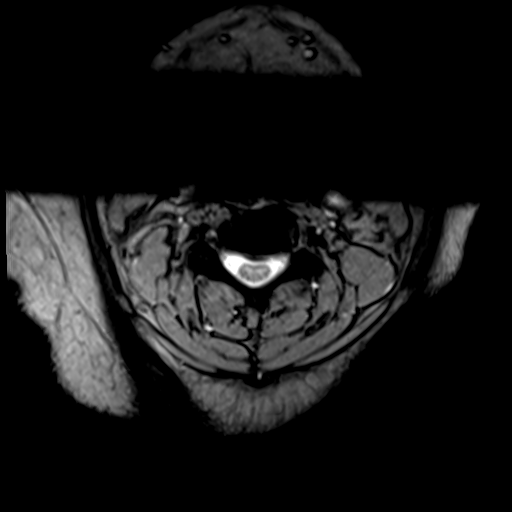
[im 20/30]
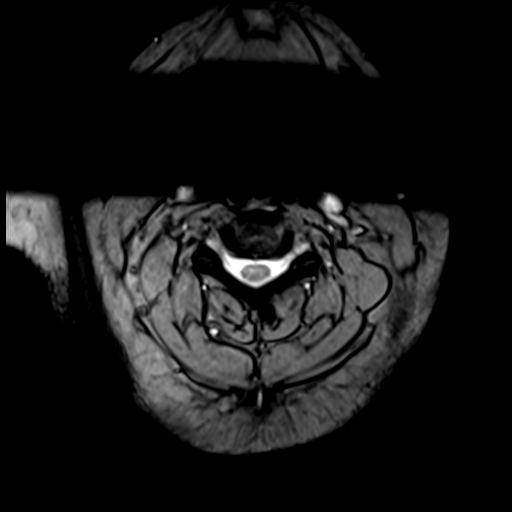
[im 25/30]
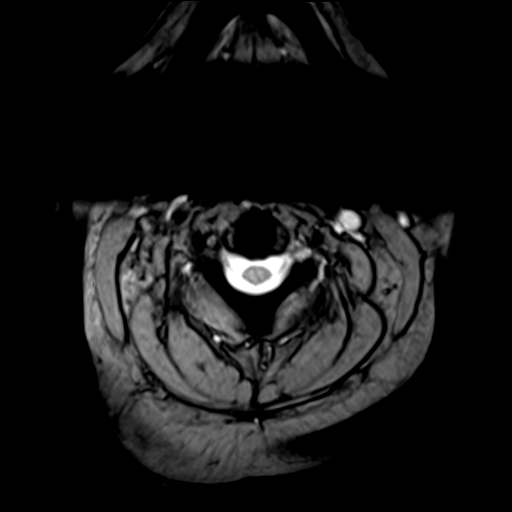
[im 30/30]
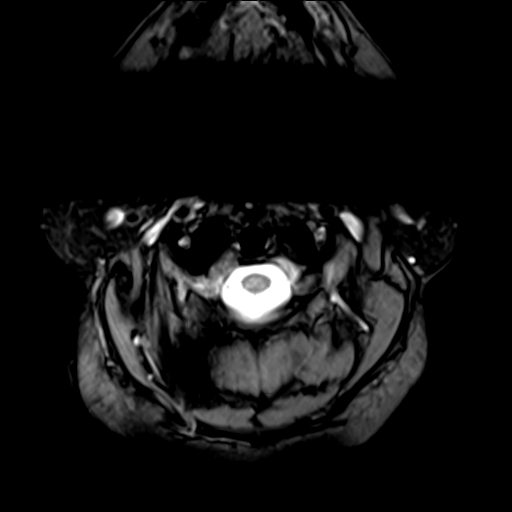

[Series 6: T2 · axial · 3.0mm · 0.78mm/px · z∈[-81,+21]mm · 9 of 30 slices shown (2 of 2)]
[im 1/30]
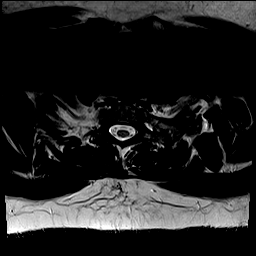
[im 5/30]
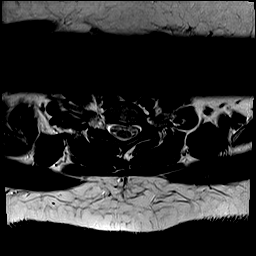
[im 10/30]
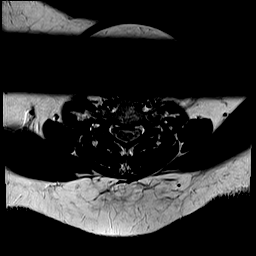
[im 13/30]
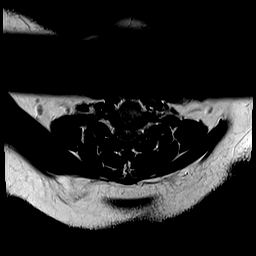
[im 15/30]
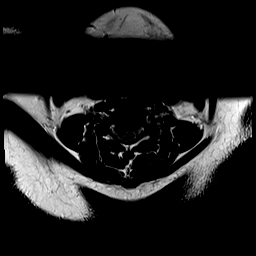
[im 17/30]
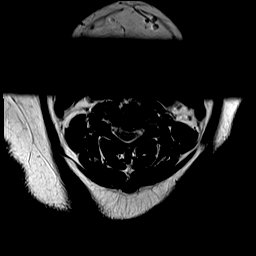
[im 20/30]
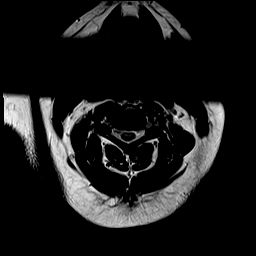
[im 25/30]
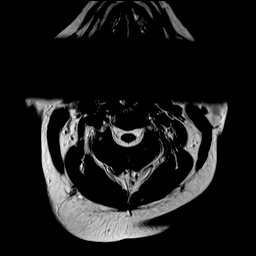
[im 30/30]
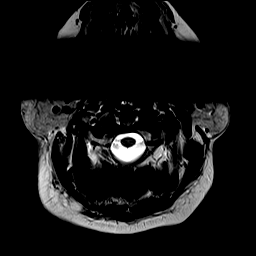

[29 of 48 positions shown; findings below may reference images not displayed]

FINDINGS: Alignment: Cervical spine straightening.  No listhesis.

Vertebrae: No fracture or suspicious marrow lesion. Degenerative
endplate changes from C4-5 to C7-T1 including mild multilevel edema.
Associated disc space narrowing at these levels, severe at C4-5 and
moderate at C5-6 and C6-7.

Cord: Normal signal.

Posterior Fossa, vertebral arteries, paraspinal tissues: Expanded
partially empty sella. Preserved vertebral artery flow voids. No
evidence of significant paraspinal soft tissue edema.

Disc levels:

C2-3: Shallow central disc protrusion without stenosis.

C3-4: Mild disc bulging and uncovertebral spurring without stenosis.

C4-5: Broad-based posterior disc osteophyte complex result in mild
spinal stenosis and mild-to-moderate left neural foraminal stenosis.

C5-6: Disc bulging/broad posterior disc protrusion slightly
eccentric to the left and uncovertebral spurring result in moderate
spinal stenosis with mild ventral cord flattening and borderline
bilateral neural foraminal stenosis.

C6-7: Broad-based posterior disc osteophyte complex results in
borderline to mild spinal stenosis and borderline to mild bilateral
neural foraminal stenosis.

C7-T1: Mild left uncovertebral spurring and mild left facet
arthrosis without significant stenosis.
IMPRESSION: 1. Advanced cervical disc degeneration most notable at C5-6 where
there is moderate spinal stenosis.
2. Mild spinal stenosis and mild-to-moderate left neural foraminal
stenosis at C4-5.
